# Patient Record
Sex: Female | Born: 1970 | Race: Black or African American | Hispanic: No | Marital: Single | Smoking: Never smoker
Health system: Southern US, Community
[De-identification: ages and names within clinical notes are randomized; demographics above are authoritative.]

## PROBLEM LIST (undated history)

## (undated) DIAGNOSIS — E785 Hyperlipidemia, unspecified: Secondary | ICD-10-CM

## (undated) DIAGNOSIS — J189 Pneumonia, unspecified organism: Secondary | ICD-10-CM

## (undated) HISTORY — PX: CHOLECYSTECTOMY: SHX55

---

## 2011-09-23 ENCOUNTER — Emergency Department (HOSPITAL_BASED_OUTPATIENT_CLINIC_OR_DEPARTMENT_OTHER)
Admission: EM | Admit: 2011-09-23 | Discharge: 2011-09-23 | Disposition: A | Discharge status: Home / Self Care | Payer: Managed Care, Other (non HMO) | Attending: Emergency Medicine | Admitting: Emergency Medicine

## 2011-09-23 DIAGNOSIS — X58XXXA Exposure to other specified factors, initial encounter: Secondary | ICD-10-CM | POA: Insufficient documentation

## 2011-09-23 DIAGNOSIS — S335XXA Sprain of ligaments of lumbar spine, initial encounter: Secondary | ICD-10-CM | POA: Insufficient documentation

## 2011-09-23 DIAGNOSIS — T148XXA Other injury of unspecified body region, initial encounter: Secondary | ICD-10-CM

## 2011-09-23 MED ORDER — CYCLOBENZAPRINE HCL 5 MG PO TABS: 5.0000 mg | ORAL_TABLET | Freq: Two times a day (BID) | ORAL | Status: AC | PRN

## 2011-09-23 MED ORDER — IBUPROFEN 800 MG PO TABS: 800.0000 mg | ORAL_TABLET | Freq: Three times a day (TID) | ORAL | Status: AC

## 2011-09-23 NOTE — ED Notes (Signed)
Left flank,lower back pain x 4 days-denies injury-pain worse with movement

## 2011-09-23 NOTE — ED Provider Notes (Signed)
Medical screening examination/treatment/procedure(s) were performed by non-physician practitioner and as supervising physician I was immediately available for consultation/collaboration.  Philomene Haff R. Tegh Franek, MD 09/23/11 2317 

## 2011-09-23 NOTE — ED Provider Notes (Signed)
History     CSN: [id number]  Arrival date & time 09/23/11  4132   First MD Initiated Contact with Patient 09/23/11 1909      Chief Complaint  Patient presents with  . Back Pain    (Consider location/radiation/quality/duration/timing/severity/associated sxs/prior treatment) Patient is a 40 y.o. female presenting with back pain. The history is provided by the patient. No language interpreter was used.  Back Pain  This is a new problem. The current episode started more than 2 days ago. The problem occurs constantly. The problem has not changed since onset.The pain is associated with no known injury. The pain is present in the lumbar spine. The quality of the pain is described as aching. The pain does not radiate. The pain is moderate. The symptoms are aggravated by bending, twisting and certain positions. The pain is the same all the time. Pertinent negatives include no headaches, no bowel incontinence, no perianal numbness, no bladder incontinence, no dysuria, no tingling and no weakness. She has tried NSAIDs for the symptoms. The treatment provided mild relief.    History reviewed. No pertinent past medical history.  Past Surgical History  Procedure Date  . Cholecystectomy   . Cesarean section     No family history on file.  History  Substance Use Topics  . Smoking status: Never Smoker   . Smokeless tobacco: Not on file  . Alcohol Use: No    OB History    Grav Para Term Preterm Abortions TAB SAB Ect Mult Living                  Review of Systems  Gastrointestinal: Negative for bowel incontinence.  Genitourinary: Negative for bladder incontinence and dysuria.  Musculoskeletal: Positive for back pain.  Neurological: Negative for tingling, weakness and headaches.  All other systems reviewed and are negative.    Allergies  Review of patient's allergies indicates no known allergies.  Home Medications   Current Outpatient Rx  Name Route Sig Dispense Refill  .  ATORVASTATIN CALCIUM 40 MG PO TABS Oral Take 40 mg by mouth daily.      . SUMATRIPTAN-NAPROXEN SODIUM 85-500 MG PO TABS Oral Take 1 tablet by mouth every 2 (two) hours as needed. For migraine     . LEVONORGESTREL 20 MCG/24HR IU IUD Intrauterine 1 each by Intrauterine route once. Inserted in 2009       BP 125/70  Pulse 88  Temp(Src) 98.2 F (36.8 C) (Oral)  Resp 16  Ht 5\' 4"  (1.626 m)  Wt 192 lb (87.091 kg)  BMI 32.96 kg/m2  SpO2 100%  Physical Exam  Nursing note and vitals reviewed. Constitutional: She is oriented to person, place, and time. She appears well-developed and well-nourished.  HENT:  Head: Normocephalic.  Eyes: Pupils are equal, round, and reactive to light.  Cardiovascular: Normal rate and regular rhythm.   Pulmonary/Chest: Effort normal and breath sounds normal.  Abdominal: Soft. Bowel sounds are normal.  Musculoskeletal:       Pt has left lumbar paraspinal tenderness  Neurological: She is alert and oriented to person, place, and time.  Skin: Skin is warm and dry.  Psychiatric: She has a normal mood and affect.    ED Course  Procedures (including critical care time)  Labs Reviewed - No data to display No results found.   1. Muscle strain       MDM  Pt not having any urinary symptoms and pain associated with movement:likely musculoskeletal:pt has no neuro deficits:will treat symptomatically  Teressa Lower, NP 09/23/11 [phone]

## 2011-10-14 ENCOUNTER — Emergency Department (INDEPENDENT_AMBULATORY_CARE_PROVIDER_SITE_OTHER): Payer: Managed Care, Other (non HMO)

## 2011-10-14 ENCOUNTER — Encounter (HOSPITAL_BASED_OUTPATIENT_CLINIC_OR_DEPARTMENT_OTHER): Payer: Self-pay | Admitting: *Deleted

## 2011-10-14 ENCOUNTER — Emergency Department (HOSPITAL_BASED_OUTPATIENT_CLINIC_OR_DEPARTMENT_OTHER)
Admission: EM | Admit: 2011-10-14 | Discharge: 2011-10-14 | Disposition: A | Discharge status: Home / Self Care | Payer: Managed Care, Other (non HMO) | Attending: Emergency Medicine | Admitting: Emergency Medicine

## 2011-10-14 DIAGNOSIS — J189 Pneumonia, unspecified organism: Secondary | ICD-10-CM

## 2011-10-14 DIAGNOSIS — R079 Chest pain, unspecified: Secondary | ICD-10-CM | POA: Insufficient documentation

## 2011-10-14 DIAGNOSIS — R0602 Shortness of breath: Secondary | ICD-10-CM

## 2011-10-14 DIAGNOSIS — R05 Cough: Secondary | ICD-10-CM

## 2011-10-14 LAB — DIFFERENTIAL
Eosinophils Relative: 1 % (ref 0–5)
Lymphocytes Relative: 19 % (ref 12–46)
Lymphs Abs: 2.4 10*3/uL (ref 0.7–4.0)
Monocytes Absolute: 0.8 10*3/uL (ref 0.1–1.0)

## 2011-10-14 LAB — CBC
HCT: 38.7 % (ref 36.0–46.0)
MCV: 88.2 fL (ref 78.0–100.0)
RDW: 12.3 % (ref 11.5–15.5)
WBC: 12.1 10*3/uL — ABNORMAL HIGH (ref 4.0–10.5)

## 2011-10-14 MED ORDER — ALBUTEROL SULFATE (5 MG/ML) 0.5% IN NEBU
INHALATION_SOLUTION | RESPIRATORY_TRACT | Status: AC
  Administered 2011-10-14: 5 mg via RESPIRATORY_TRACT
  Filled 2011-10-14: qty 1

## 2011-10-14 MED ORDER — ALBUTEROL SULFATE HFA 108 (90 BASE) MCG/ACT IN AERS
2.0000 | INHALATION_SPRAY | RESPIRATORY_TRACT | Status: DC | PRN
  Administered 2011-10-14: 2 via RESPIRATORY_TRACT
  Filled 2011-10-14: qty 6.7

## 2011-10-14 MED ORDER — HYDROCODONE-ACETAMINOPHEN 7.5-500 MG/15ML PO SOLN: 15.0000 mL | Freq: Four times a day (QID) | ORAL | Status: AC | PRN

## 2011-10-14 MED ORDER — ALBUTEROL SULFATE (5 MG/ML) 0.5% IN NEBU
5.0000 mg | INHALATION_SOLUTION | Freq: Once | RESPIRATORY_TRACT | Status: AC
  Administered 2011-10-14: 5 mg via RESPIRATORY_TRACT

## 2011-10-14 MED ORDER — MOXIFLOXACIN HCL 400 MG PO TABS: 400.0000 mg | ORAL_TABLET | Freq: Every day | ORAL | Status: AC

## 2011-10-14 NOTE — ED Provider Notes (Signed)
History     CSN: [id number]  Arrival date & time 10/14/11  4098   First MD Initiated Contact with Patient 10/14/11 [phone]      Chief Complaint  Patient presents with  . Shortness of Breath    penumonia diagnoses 10/11/11    (Consider location/radiation/quality/duration/timing/severity/associated sxs/prior treatment) HPI Comments: Patient diagnosed with pneumonia almost a week ago but has persistent right-sided chest pain, cough, shortness of breath worse with exertion.  Patient is a 41 y.o. female presenting with shortness of breath and cough. The history is provided by the patient.  Shortness of Breath  The current episode started more than 1 week ago. The onset was gradual. The problem occurs frequently. The problem has been unchanged. The problem is moderate. The symptoms are relieved by rest. The symptoms are aggravated by activity. Associated symptoms include chest pain, a fever, cough, shortness of breath and wheezing. Pertinent negatives include no rhinorrhea and no sore throat. The cough is productive. The cough is worsened by activity. She has had no prior steroid use. She has had no prior hospitalizations. She has had no prior ICU admissions. She has had no prior intubations. Her past medical history does not include asthma. She has been behaving normally. Recently, medical care has been given by the PCP. Services received include medications given.  Cough The maximum temperature recorded prior to her arrival was 101 to 101.9 F. The fever has been present for 1 to 2 days. Associated symptoms include chest pain, shortness of breath and wheezing. Pertinent negatives include no rhinorrhea and no sore throat. She has tried decongestants and cough syrup for the symptoms. The treatment provided no relief. She is not a smoker. Her past medical history does not include asthma.    History reviewed. No pertinent past medical history.  Past Surgical History  Procedure Date  . Cholecystectomy    . Cesarean section     No family history on file.  History  Substance Use Topics  . Smoking status: Never Smoker   . Smokeless tobacco: Not on file  . Alcohol Use: No    OB History    Grav Para Term Preterm Abortions TAB SAB Ect Mult Living                  Review of Systems  Constitutional: Positive for fever.  HENT: Negative for sore throat and rhinorrhea.   Respiratory: Positive for cough, shortness of breath and wheezing.   Cardiovascular: Positive for chest pain.  All other systems reviewed and are negative.    Allergies  Review of patient's allergies indicates no known allergies.  Home Medications   Current Outpatient Rx  Name Route Sig Dispense Refill  . ACETAMINOPHEN-CODEINE 120-12 MG/5ML PO SOLN Oral Take 5 mLs by mouth every 6 (six) hours as needed.    . AZITHROMYCIN 250 MG PO TABS Oral Take 250 mg by mouth daily.    . ATORVASTATIN CALCIUM 40 MG PO TABS Oral Take 40 mg by mouth daily.      Marland Kitchen LEVONORGESTREL 20 MCG/24HR IU IUD Intrauterine 1 each by Intrauterine route once. Inserted in 2009     . SUMATRIPTAN-NAPROXEN SODIUM 85-500 MG PO TABS Oral Take 1 tablet by mouth every 2 (two) hours as needed. For migraine       BP 139/83  Pulse 120  Temp(Src) 99.6 F (37.6 C) (Oral)  Resp 22  SpO2 100%  Physical Exam  Nursing note and vitals reviewed. Constitutional: She is oriented to person, place,  and time. She appears well-developed and well-nourished. No distress.  HENT:  Head: Normocephalic and atraumatic.  Mouth/Throat: Oropharynx is clear and moist.  Eyes: EOM are normal. Pupils are equal, round, and reactive to light.  Cardiovascular: Normal rate, regular rhythm, normal heart sounds and intact distal pulses.  Exam reveals no friction rub.   No murmur heard. Pulmonary/Chest: Effort normal. She has no decreased breath sounds. She has no wheezes. She has rhonchi in the right middle field and the right lower field. She has no rales. She exhibits  tenderness.    Abdominal: Soft. Bowel sounds are normal. She exhibits no distension. There is no tenderness. There is no rebound and no guarding.  Musculoskeletal: Normal range of motion. She exhibits no tenderness.       No edema  Neurological: She is alert and oriented to person, place, and time. No cranial nerve deficit.  Skin: Skin is warm and dry. No rash noted.  Psychiatric: She has a normal mood and affect. Her behavior is normal.    ED Course  Procedures (including critical care time)  Labs Reviewed  CBC - Abnormal; Notable for the following:    WBC 12.1 (*)    All other components within normal limits  DIFFERENTIAL - Abnormal; Notable for the following:    Neutro Abs 8.8 (*)    All other components within normal limits  BASIC METABOLIC PANEL   Dg Chest 2 View  10/14/2011  *RADIOLOGY REPORT*  Clinical Data: Cough and shortness of breath.  CHEST - 2 VIEW  Comparison: None.  Findings: Airspace disease is seen in the right lower lobe, consistent with pneumonia.  Left lung is clear.  No evidence of pleural effusion.  Heart size and mediastinal contours within normal limits.  IMPRESSION: Right lower lobe airspace disease, consistent with pneumonia.  Original Report Authenticated By: Danae Orleans, M.D.     1. CAP (community acquired pneumonia)       MDM  Patient was seen and diagnosed with pneumonia 3 days ago and has been taking azithromycin with no improvement. She states the coughing and shortness of breath is worsening along with a right-sided chest pain. Patient is having 100% on room air here but does become tachypneic with speaking. She denies a history of asthma or smoking. Chest x-ray here is consistent for a right lower lobe pneumonia we will change her to Avelox for broader coverage than just azithromycin. CBC with a leukocytosis of 12,000 otherwise within normal limits.  11:13 AM Chest x-ray with obvious right lower lobe pneumonia. Satting 100% on room air while at  rest and when ambulates pulse ox remained 93% and above. Patient states her symptoms are much better after having a breathing treatment. She was only taking azithromycin for her symptoms. Will change her to Avelox and give albuterol. Will help her followup with her regular physician.        Gwyneth Sprout, MD 10/14/11 1426

## 2011-10-14 NOTE — ED Notes (Signed)
Patient states she was diagnosed with pneumonia on 10/11/11 at [facility].  Was treated with an injection of antibiotic and given cough medications.  States she continues to be short of breath with mild exertion, productive cough with yellow secretions and is not sleeping due to cough.

## 2011-10-21 ENCOUNTER — Emergency Department (HOSPITAL_BASED_OUTPATIENT_CLINIC_OR_DEPARTMENT_OTHER)
Admission: EM | Admit: 2011-10-21 | Discharge: 2011-10-21 | Disposition: A | Discharge status: Home / Self Care | Payer: Managed Care, Other (non HMO) | Attending: Emergency Medicine | Admitting: Emergency Medicine

## 2011-10-21 ENCOUNTER — Encounter (HOSPITAL_BASED_OUTPATIENT_CLINIC_OR_DEPARTMENT_OTHER): Payer: Self-pay

## 2011-10-21 ENCOUNTER — Emergency Department (INDEPENDENT_AMBULATORY_CARE_PROVIDER_SITE_OTHER): Payer: Managed Care, Other (non HMO)

## 2011-10-21 DIAGNOSIS — R51 Headache: Secondary | ICD-10-CM | POA: Insufficient documentation

## 2011-10-21 DIAGNOSIS — Z09 Encounter for follow-up examination after completed treatment for conditions other than malignant neoplasm: Secondary | ICD-10-CM | POA: Insufficient documentation

## 2011-10-21 DIAGNOSIS — Z8701 Personal history of pneumonia (recurrent): Secondary | ICD-10-CM

## 2011-10-21 DIAGNOSIS — J189 Pneumonia, unspecified organism: Secondary | ICD-10-CM

## 2011-10-21 DIAGNOSIS — Z79899 Other long term (current) drug therapy: Secondary | ICD-10-CM | POA: Insufficient documentation

## 2011-10-21 DIAGNOSIS — E785 Hyperlipidemia, unspecified: Secondary | ICD-10-CM | POA: Insufficient documentation

## 2011-10-21 HISTORY — DX: Pneumonia, unspecified organism: J18.9

## 2011-10-21 HISTORY — DX: Hyperlipidemia, unspecified: E78.5

## 2011-10-21 MED ORDER — ALBUTEROL SULFATE (5 MG/ML) 0.5% IN NEBU
5.0000 mg | INHALATION_SOLUTION | Freq: Once | RESPIRATORY_TRACT | Status: AC
  Administered 2011-10-21: 5 mg via RESPIRATORY_TRACT
  Filled 2011-10-21: qty 1

## 2011-10-21 NOTE — ED Notes (Signed)
IV charted as d/c'd today but actually d/c'd prior to d/c on 10/14/11

## 2011-10-21 NOTE — ED Notes (Signed)
Patient transported to X-ray 

## 2011-10-21 NOTE — ED Provider Notes (Signed)
History     CSN: [id number]  Arrival date & time 10/21/11  2951   First MD Initiated Contact with Patient 10/21/11 [phone]      Chief Complaint  Patient presents with  . Follow-up    (Consider location/radiation/quality/duration/timing/severity/associated sxs/prior treatment) HPI The patient presents one week after being seen in this emergency department for pneumonia. She notes that over the past week she has completed her antibiotics, has no new fever, no vomiting, decreasing cough, decreasing discomfort. She does note a mild headache. She presents today for followup and request for an additional week off of work. Past Medical History  Diagnosis Date  . Pneumonia   . Hyperlipemia     Past Surgical History  Procedure Date  . Cholecystectomy   . Cesarean section     No family history on file.  History  Substance Use Topics  . Smoking status: Never Smoker   . Smokeless tobacco: Not on file  . Alcohol Use: No    OB History    Grav Para Term Preterm Abortions TAB SAB Ect Mult Living                  Review of Systems  Constitutional: Negative for fever and chills.  HENT: Positive for rhinorrhea. Negative for sore throat.   Eyes: Negative.   Respiratory: Positive for cough. Negative for shortness of breath and wheezing.   Cardiovascular: Negative for chest pain.  Gastrointestinal: Negative for vomiting and diarrhea.  Genitourinary: Negative.   Musculoskeletal: Positive for myalgias.  Skin: Negative.   Neurological: Positive for headaches.  Psychiatric/Behavioral: Negative.   All other systems reviewed and are negative.    Allergies  Review of patient's allergies indicates no known allergies.  Home Medications   Current Outpatient Rx  Name Route Sig Dispense Refill  . ACETAMINOPHEN-CODEINE 120-12 MG/5ML PO SOLN Oral Take 5 mLs by mouth every 6 (six) hours as needed.    . ATORVASTATIN CALCIUM 40 MG PO TABS Oral Take 40 mg by mouth daily.      . AZITHROMYCIN  250 MG PO TABS Oral Take 250 mg by mouth daily.    Marland Kitchen HYDROCODONE-ACETAMINOPHEN 7.5-500 MG/15ML PO SOLN Oral Take 15 mLs by mouth every 6 (six) hours as needed for pain or cough. 480 mL 0  . LEVONORGESTREL 20 MCG/24HR IU IUD Intrauterine 1 each by Intrauterine route once. Inserted in 2009     . MOXIFLOXACIN HCL 400 MG PO TABS Oral Take 1 tablet (400 mg total) by mouth daily. 10 tablet 0  . SUMATRIPTAN-NAPROXEN SODIUM 85-500 MG PO TABS Oral Take 1 tablet by mouth every 2 (two) hours as needed. For migraine       BP 133/81  Pulse 110  Temp(Src) 98.7 F (37.1 C) (Oral)  Resp 18  Ht 5\' 4"  (1.626 m)  Wt 185 lb (83.915 kg)  BMI 31.76 kg/m2  SpO2 96%  Physical Exam  Nursing note and vitals reviewed. Constitutional: She is oriented to person, place, and time. She appears well-developed and well-nourished. No distress.  HENT:  Head: Normocephalic and atraumatic.  Mouth/Throat: Oropharynx is clear and moist.  Eyes: Conjunctivae and EOM are normal.  Cardiovascular: Regular rhythm.  Tachycardia present.   Pulmonary/Chest: Effort normal and breath sounds normal. No stridor. No respiratory distress.  Abdominal: She exhibits no distension.  Musculoskeletal: She exhibits no edema.  Neurological: She is alert and oriented to person, place, and time. No cranial nerve deficit. She exhibits normal muscle tone. Coordination normal.  Skin:  Skin is warm and dry. She is not diaphoretic.  Psychiatric: She has a normal mood and affect.    ED Course  Procedures (including critical care time)  Labs Reviewed - No data to display No results found.   No diagnosis found.  CXR: interval improvement in R LL pna.    MDM  This patient presents one week after a diagnosis of pneumonia made in this emergency department. On exam the patient is in no distress, and her description of clinical improvement, consisting of no fever, no vomiting, decreased cough, decreased congestion, increased activity level is all  reassuring for resolving pneumonia.  The patient has completed a course of antibiotics, and given the aforementioned improvements, resumption of antibiotics is not indicated. The patient is stable for discharge, was counseled on the necessity of primary care for further followup, and to a sister with long-term work absence, which is her request.        Gerhard Munch, MD 10/21/11 1040

## 2011-10-21 NOTE — ED Notes (Signed)
RRT at bedside administering neb tx. 

## 2011-10-21 NOTE — ED Notes (Signed)
Pt reports she is here for follow-up.  Seen Thursday, diagnosed with pneumonia and completed PO ABX.

## 2012-10-21 IMAGING — CR DG CHEST 2V
2 series · 2 of 2 positions shown · non-contrast
Comparison: 10/14/2011

CLINICAL DATA: Follow up pneumonia

CHEST - 2 VIEW

[w chest pa]
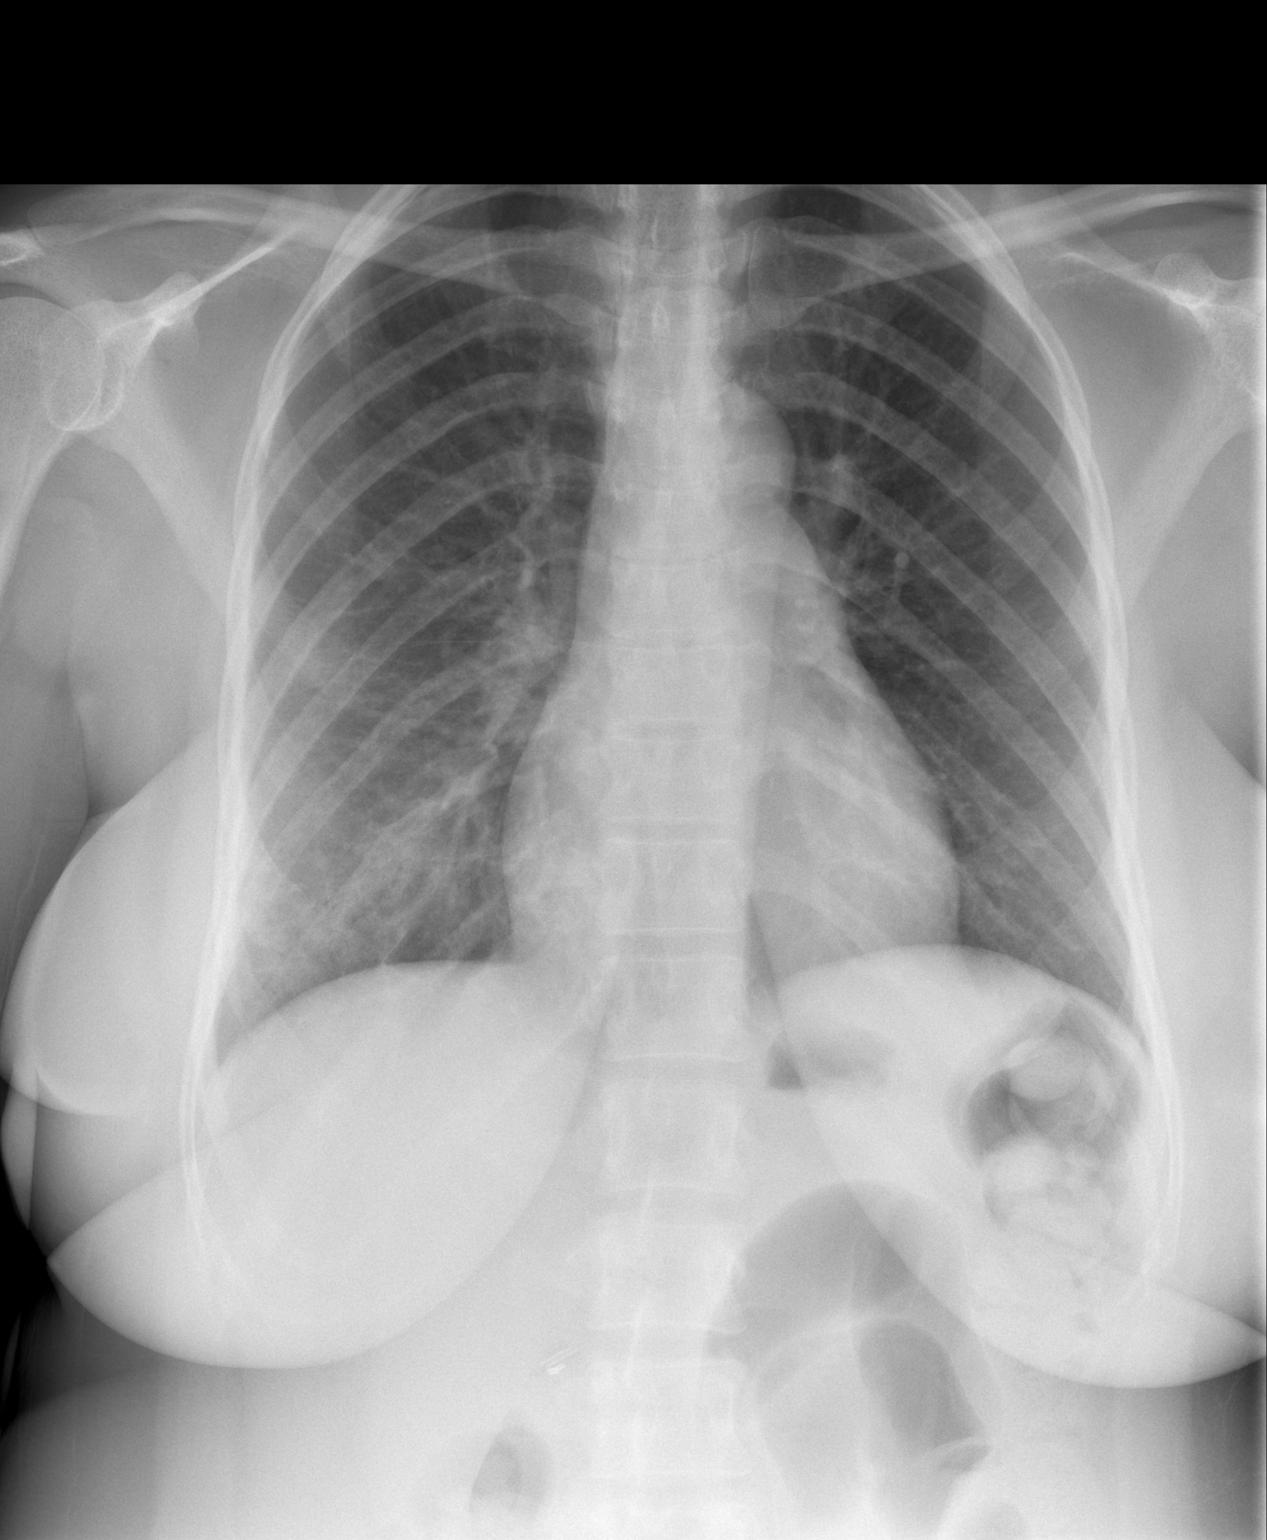

[w chest lat]
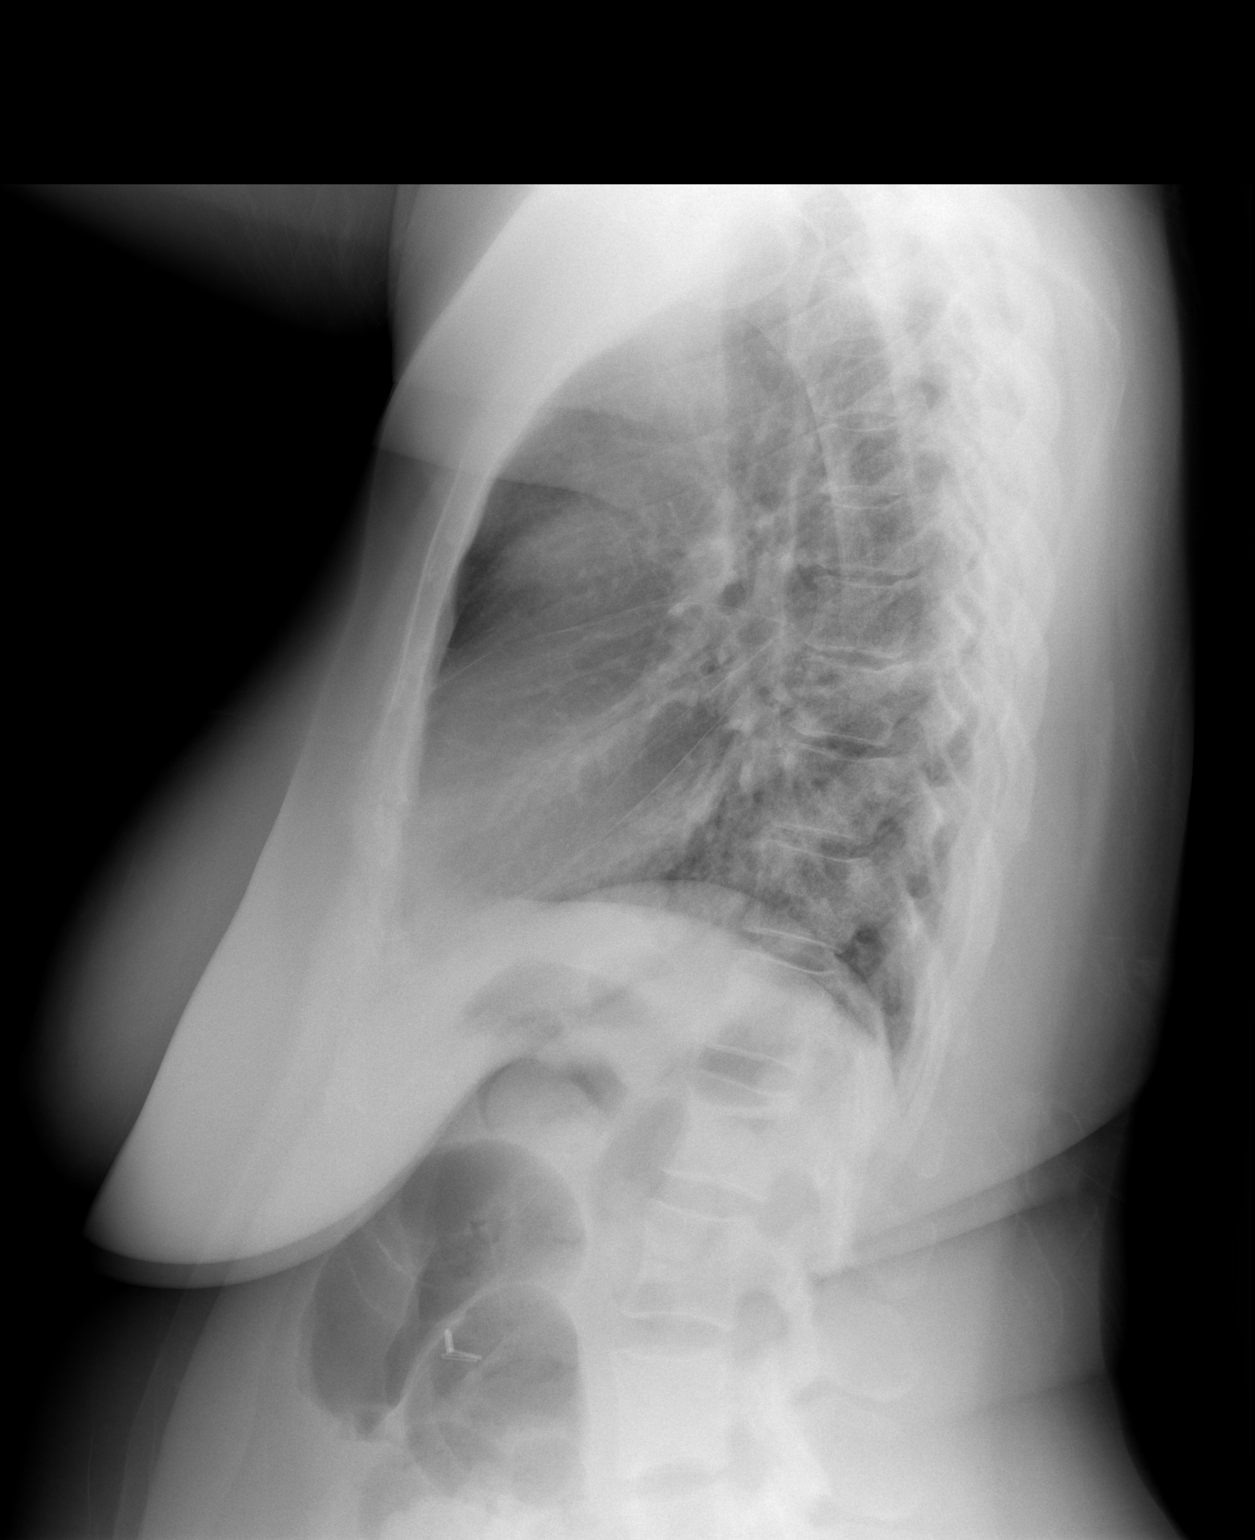

[2 of 2 positions shown; findings below may reference images not displayed]

FINDINGS: Cardiomediastinal silhouette is stable.  There is patchy
residual right lower lobe infiltrate with slight improvement in
aeration.  No new infiltrate noted.  No pulmonary edema.  Left lung
is clear.
IMPRESSION: Patchy residual right lower lobe infiltrate with slight improvement
in aeration.  No new infiltrate is noted. Follow-up to complete
resolution is recommended.

## 2017-12-25 ENCOUNTER — Encounter (HOSPITAL_BASED_OUTPATIENT_CLINIC_OR_DEPARTMENT_OTHER): Payer: Self-pay | Admitting: Emergency Medicine

## 2017-12-25 ENCOUNTER — Other Ambulatory Visit: Payer: Self-pay

## 2017-12-25 ENCOUNTER — Emergency Department (HOSPITAL_BASED_OUTPATIENT_CLINIC_OR_DEPARTMENT_OTHER)
Admission: EM | Admit: 2017-12-25 | Discharge: 2017-12-25 | Disposition: A | Discharge status: Home / Self Care | Payer: BLUE CROSS/BLUE SHIELD | Attending: Emergency Medicine | Admitting: Emergency Medicine

## 2017-12-25 DIAGNOSIS — Z79899 Other long term (current) drug therapy: Secondary | ICD-10-CM | POA: Diagnosis not present

## 2017-12-25 DIAGNOSIS — J0191 Acute recurrent sinusitis, unspecified: Secondary | ICD-10-CM | POA: Insufficient documentation

## 2017-12-25 DIAGNOSIS — R0981 Nasal congestion: Secondary | ICD-10-CM | POA: Diagnosis present

## 2017-12-25 MED ORDER — FLUTICASONE PROPIONATE 50 MCG/ACT NA SUSP: 2.0000 | Freq: Every day | NASAL | 12 refills | Status: AC

## 2017-12-25 MED ORDER — DOXYCYCLINE HYCLATE 100 MG PO CAPS: 100.0000 mg | ORAL_CAPSULE | Freq: Two times a day (BID) | ORAL | 0 refills | Status: AC

## 2017-12-25 NOTE — Discharge Instructions (Signed)
Your symptoms seem consistent with a sinus infection.  I would recommend using the Flonase for nasal congestion.  I would also recommend using an over-the-counter nasal saline spray to help prevent nosebleeds.  Apply Vaseline to your nose.  Also use an over-the-counter Nettie pot E for sinus rinses.  Would avoid nasal decongestants such as Sudafed.  Have given you antibiotics to take to help with infection.  Follow with ENT your primary care doctor if symptoms persist and return to ED with any worsening symptoms.

## 2017-12-25 NOTE — ED Provider Notes (Signed)
[facility] EMERGENCY DEPARTMENT Provider Note   CSN: [id number] Arrival date & time: 12/25/17  1125     History   Chief Complaint Chief Complaint  Patient presents with  . Nasal Congestion    HPI Krystal Anthony is a 47 y.o. female.  HPI 47 year old African-American female with no pertinent past medical history presents to the ED with complaints of sinus congestion, sinus pain, rhinorrhea, ear fullness ongoing for the past week.  Patient states that she is also had some intermittent nosebleeds.  Sore throat or productive cough.  Does report fevers and chills at home.  Fevers have been subjective in nature.  She been taking Tylenol for her fevers.  Patient states that one month ago she had an upper respiratory tract infection was treated with antibiotics.  She is unsure what antibiotic it was.  States that she did improve however her symptoms returned.  She has been around several people with the flu.  Denies any associated nausea, vomiting or diarrhea.  Patient has been using some nasal spray for her nose.  States that it is hard to breathe out of her nose due to the congestion.  Reports thick green discharge.  Nothing makes symptoms better or worse. Past Medical History:  Diagnosis Date  . Hyperlipemia   . Pneumonia     There are no active problems to display for this patient.   Past Surgical History:  Procedure Laterality Date  . CESAREAN SECTION    . CHOLECYSTECTOMY       OB History   None      Home Medications    Prior to Admission medications   Medication Sig Start Date End Date Taking? Authorizing Provider  acetaminophen-codeine 120-12 MG/5ML solution Take 5 mLs by mouth every 6 (six) hours as needed.    [provider]  atorvastatin (LIPITOR) 40 MG tablet Take 40 mg by mouth daily.      [provider]  azithromycin (ZITHROMAX) 250 MG tablet Take 250 mg by mouth daily.    [provider]  doxycycline (VIBRAMYCIN) 100 MG  capsule Take 1 capsule (100 mg total) by mouth 2 (two) times daily. 12/25/17   Rise MuLeaphart, Rakisha Pincock T, PA-C  fluticasone (FLONASE) 50 MCG/ACT nasal spray Place 2 sprays into both nostrils daily. 12/25/17   Rise MuLeaphart, Karyn Brull T, PA-C  levonorgestrel (MIRENA) 20 MCG/24HR IUD 1 each by Intrauterine route once. Inserted in 2009     [provider]  SUMAtriptan-naproxen (TREXIMET) 85-500 MG per tablet Take 1 tablet by mouth every 2 (two) hours as needed. For migraine     [provider]    Family History No family history on file.  Social History Social History   Tobacco Use  . Smoking status: Never Smoker  . Smokeless tobacco: Never Used  Substance Use Topics  . Alcohol use: No  . Drug use: No     Allergies   Patient has no known allergies.   Review of Systems Review of Systems  All other systems reviewed and are negative.    Physical Exam Updated Vital Signs BP 121/87 (BP Location: Left Arm)   Pulse 98   Temp 98.9 F (37.2 C) (Oral)   Resp 18   Ht 5\' 4"  (1.626 m)   Wt 89.8 kg (198 lb)   SpO2 97%   BMI 33.99 kg/m   Physical Exam  Constitutional: She appears well-developed and well-nourished. No distress.  HENT:  Head: Normocephalic and atraumatic.  Right Ear: Tympanic membrane, external  ear and ear canal normal.  Left Ear: Tympanic membrane, external ear and ear canal normal.  Nose: Mucosal edema and rhinorrhea present. Right sinus exhibits maxillary sinus tenderness and frontal sinus tenderness. Left sinus exhibits maxillary sinus tenderness and frontal sinus tenderness.  Mouth/Throat: Uvula is midline, oropharynx is clear and moist and mucous membranes are normal. No trismus in the jaw. No tonsillar exudate.  Eyes: Right eye exhibits no discharge. Left eye exhibits no discharge. No scleral icterus.  Neck: Normal range of motion. Neck supple.  Pulmonary/Chest: Effort normal and breath sounds normal. No stridor. No respiratory distress. She has no  wheezes. She has no rales. She exhibits no tenderness.  Musculoskeletal: Normal range of motion.  Lymphadenopathy:    She has no cervical adenopathy.  Neurological: She is alert.  Skin: No pallor.  Psychiatric: Her behavior is normal. Judgment and thought content normal.  Nursing note and vitals reviewed.    ED Treatments / Results  Labs (all labs ordered are listed, but only abnormal results are displayed) Labs Reviewed - No data to display  EKG None  Radiology No results found.  Procedures Procedures (including critical care time)  Medications Ordered in ED Medications - No data to display   Initial Impression / Assessment and Plan / ED Course  I have reviewed the triage vital signs and the nursing notes.  Pertinent labs & imaging results that were available during my care of the patient were reviewed by me and considered in my medical decision making (see chart for details).     Patient complaining of symptoms of sinusitis.    Severe symptoms have been present for greater than 7 days with purulent nasal discharge and maxillary sinus pain.  Concern for acute bacterial rhinosinusitis.  Patient discharged with doxycycline.  Instructions given for warm saline nasal wash and recommendations for follow-up with primary care physician.  Gave ent follow up.  Pt is hemodynamically stable, in NAD, & able to ambulate in the ED. Evaluation does not show pathology that would require ongoing emergent intervention or inpatient treatment. I explained the diagnosis to the patient. Pain has been managed & has no complaints prior to dc. Pt is comfortable with above plan and is stable for discharge at this time. All questions were answered prior to disposition. Strict return precautions for f/u to the ED were discussed. Encouraged follow up with PCP.      Final Clinical Impressions(s) / ED Diagnoses   Final diagnoses:  Acute recurrent sinusitis, unspecified location    ED Discharge  Orders        Ordered    doxycycline (VIBRAMYCIN) 100 MG capsule  2 times daily     12/25/17 1329    fluticasone (FLONASE) 50 MCG/ACT nasal spray  Daily     12/25/17 1329       Rise Mu, PA-C 12/25/17 1342    Cathren Laine, MD 12/25/17 1359

## 2017-12-25 NOTE — ED Triage Notes (Signed)
Nasal congestion and ear fullness x 3 days. Denies cough, sore throat.
